# Patient Record
Sex: Female | Born: 1995 | Race: White | Hispanic: No | Marital: Single | Smoking: Never smoker
Health system: Southern US, Community
[De-identification: ages and names within clinical notes are randomized; demographics above are authoritative.]

---
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
## 2017-10-18 DIAGNOSIS — Z23 Encounter for immunization: Secondary | ICD-10-CM | POA: Diagnosis not present

## 2017-10-19 DIAGNOSIS — B349 Viral infection, unspecified: Secondary | ICD-10-CM | POA: Diagnosis not present

## 2017-11-12 DIAGNOSIS — Z0000 Encounter for general adult medical examination without abnormal findings: Secondary | ICD-10-CM | POA: Diagnosis not present

## 2017-11-12 DIAGNOSIS — Z68.41 Body mass index (BMI) pediatric, 5th percentile to less than 85th percentile for age: Secondary | ICD-10-CM | POA: Diagnosis not present

## 2017-11-12 DIAGNOSIS — Z7182 Exercise counseling: Secondary | ICD-10-CM | POA: Diagnosis not present

## 2017-11-12 DIAGNOSIS — Z23 Encounter for immunization: Secondary | ICD-10-CM | POA: Diagnosis not present

## 2017-11-12 DIAGNOSIS — Z713 Dietary counseling and surveillance: Secondary | ICD-10-CM | POA: Diagnosis not present

## 2019-01-07 DIAGNOSIS — B373 Candidiasis of vulva and vagina: Secondary | ICD-10-CM | POA: Diagnosis not present

## 2019-05-04 DIAGNOSIS — F411 Generalized anxiety disorder: Secondary | ICD-10-CM | POA: Diagnosis not present

## 2019-05-11 DIAGNOSIS — F411 Generalized anxiety disorder: Secondary | ICD-10-CM | POA: Diagnosis not present

## 2019-05-18 DIAGNOSIS — F411 Generalized anxiety disorder: Secondary | ICD-10-CM | POA: Diagnosis not present

## 2019-10-13 DIAGNOSIS — H6121 Impacted cerumen, right ear: Secondary | ICD-10-CM | POA: Diagnosis not present

## 2019-10-13 DIAGNOSIS — J309 Allergic rhinitis, unspecified: Secondary | ICD-10-CM | POA: Diagnosis not present

## 2019-10-13 DIAGNOSIS — Z03818 Encounter for observation for suspected exposure to other biological agents ruled out: Secondary | ICD-10-CM | POA: Diagnosis not present

## 2019-11-12 DIAGNOSIS — L700 Acne vulgaris: Secondary | ICD-10-CM | POA: Diagnosis not present

## 2019-11-12 DIAGNOSIS — L858 Other specified epidermal thickening: Secondary | ICD-10-CM | POA: Diagnosis not present

## 2020-06-01 ENCOUNTER — Ambulatory Visit: Payer: BC Managed Care – PPO | Attending: Internal Medicine

## 2020-06-01 ENCOUNTER — Encounter (HOSPITAL_COMMUNITY): Payer: Self-pay

## 2020-06-01 ENCOUNTER — Emergency Department (HOSPITAL_COMMUNITY)
Admission: EM | Admit: 2020-06-01 | Discharge: 2020-06-01 | Disposition: A | Discharge status: Home / Self Care | Payer: BC Managed Care – PPO | Attending: Emergency Medicine | Admitting: Emergency Medicine

## 2020-06-01 ENCOUNTER — Other Ambulatory Visit: Payer: Self-pay

## 2020-06-01 DIAGNOSIS — R002 Palpitations: Secondary | ICD-10-CM | POA: Diagnosis present

## 2020-06-01 DIAGNOSIS — R519 Headache, unspecified: Secondary | ICD-10-CM | POA: Diagnosis not present

## 2020-06-01 DIAGNOSIS — R000 Tachycardia, unspecified: Secondary | ICD-10-CM | POA: Diagnosis not present

## 2020-06-01 LAB — TSH: TSH: 1.767 u[IU]/mL (ref 0.350–4.500)

## 2020-06-01 LAB — BASIC METABOLIC PANEL
Anion gap: 10 (ref 5–15)
BUN: 8 mg/dL (ref 6–20)
CO2: 23 mmol/L (ref 22–32)
Calcium: 9.5 mg/dL (ref 8.9–10.3)
Chloride: 107 mmol/L (ref 98–111)
Creatinine, Ser: 0.91 mg/dL (ref 0.44–1.00)
GFR calc Af Amer: >60 mL/min (ref >60)
GFR calc non Af Amer: >60 mL/min (ref >60)
Glucose, Bld: 120 mg/dL — ABNORMAL HIGH (ref 70–99)
Potassium: 3.5 mmol/L (ref 3.5–5.1)
Sodium: 140 mmol/L (ref 135–145)

## 2020-06-01 LAB — I-STAT BETA HCG BLOOD, ED (MC, WL, AP ONLY): I-stat hCG, quantitative: <5 m[IU]/mL (ref <5)

## 2020-06-01 LAB — CBC
HCT: 42 % (ref 36.0–46.0)
Hemoglobin: 13.9 g/dL (ref 12.0–15.0)
MCH: 31 pg (ref 26.0–34.0)
MCHC: 33.1 g/dL (ref 30.0–36.0)
MCV: 93.5 fL (ref 80.0–100.0)
Platelets: 301 10*3/uL (ref 150–400)
RBC: 4.49 MIL/uL (ref 3.87–5.11)
RDW: 12.1 % (ref 11.5–15.5)
WBC: 4.7 10*3/uL (ref 4.0–10.5)
nRBC: 0 % (ref 0.0–0.2)

## 2020-06-01 LAB — TROPONIN I (HIGH SENSITIVITY): Troponin I (High Sensitivity): <2 ng/L (ref <18)

## 2020-06-01 NOTE — ED Notes (Signed)
Pt discharge instructions and follow-up care reviewed with the patient. The patient verbalized understanding of both. Pt discharged. 

## 2020-06-01 NOTE — Discharge Instructions (Addendum)
Home to rest and hydrate.  Return to ER for worsening or concerning symptoms otherwise follow-up with your PCP as planned.

## 2020-06-01 NOTE — ED Triage Notes (Signed)
Pt reports headache since the 4th after drinking all day. Pt states the headache has been constant since then. Pt concerned about her tachycardia, HR 120s in triage. Pt also reports palpitations for a while.

## 2020-06-01 NOTE — ED Provider Notes (Signed)
[facility] EMERGENCY DEPARTMENT Provider Note   CSN: [id number] Arrival date & time: 06/01/20  0941     History Chief Complaint  Patient presents with  . Headache  . Anxiety    Stacy Abbott is a 24 y.o. female.  24 year old female presents with headache and palpitations. Patient states for the past few years she has noticed that her heart races at times, has never had any associated chest pain or shortness of breath, has not been worked up for this. Patient states for the past 3 days she notices her heart rate increases with standing up, does not feel weak/dizzy/SHOB/CP with this. Also reports band distribution headache across occipital/mastoid area earlier today that has since resolved. Patient went to UC today for her symptoms, had an abnormal EKG with rate of 130 and was sent to the ER. Patient states this made her feel very anxious by the time she arrived in the ER but after an extensive wait time she is feeling better and symptoms have resolved. No other complaints or concerns at this time.         History reviewed. No pertinent past medical history.  Patient Active Problem List   Diagnosis Date Noted  . 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 GAD (generalized anxiety disorder) 09/30/2015  . 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 No pertinent surgical history.   OB History   No obstetric history on file.     No family history on file.  Social History   Tobacco Use  . Smoking status: Never Smoker  Substance Use Topics  . Alcohol use: No  . Drug use: No    Home Medications Prior to Admission medications   Medication Sig Start Date End Date Taking? Authorizing Provider  hydrOXYzine (VISTARIL) 25 MG capsule Take 1 capsule (25 mg total) by mouth daily at 8 pm. 06/01/16   Leata Mouse, MD  lisdexamfetamine (VYVANSE) 30 MG capsule Take 1 capsule (30 mg total) by mouth  daily. 06/01/16   Leata Mouse, MD    Allergies    Patient has no known allergies.  Review of Systems   Review of Systems  Constitutional: Negative for fever.  Respiratory: Negative for chest tightness and shortness of breath.   Cardiovascular: Positive for palpitations. Negative for chest pain.  Gastrointestinal: Negative for nausea and vomiting.  Musculoskeletal: Negative for arthralgias and myalgias.  Skin: Negative for rash and wound.  Allergic/Immunologic: Negative for immunocompromised state.  Neurological: Negative for dizziness, weakness and light-headedness.  Psychiatric/Behavioral: Negative for confusion.  All other systems reviewed and are negative.   Physical Exam Updated Vital Signs BP (!) 147/95 (BP Location: Right Arm)   Pulse 96   Temp 98.4 F (36.9 C) (Oral)   Resp 18   Ht 5\' 5"  (1.651 m)   Wt 56.7 kg   LMP 05/25/2020   SpO2 100%   BMI 20.80 kg/m   Physical Exam Vitals and nursing note reviewed.  Constitutional:      General: She is not in acute distress.    Appearance: She is well-developed. She is not diaphoretic.  HENT:     Head: Normocephalic and atraumatic.  Cardiovascular:     Heart sounds: Normal heart sounds.  Pulmonary:     Effort: Pulmonary effort is normal.     Breath sounds: Normal breath sounds.  Skin:    General: Skin is warm and dry.     Findings: No erythema or rash.  Neurological:     Mental Status: She is alert and oriented to person, place, and time.     GCS: GCS eye subscore is 4. GCS verbal subscore is 5. GCS motor subscore is 6.     Cranial Nerves: No facial asymmetry.     Sensory: No sensory deficit.     Motor: No weakness.     Coordination: Romberg sign negative. Coordination normal.     Gait: Gait normal.  Psychiatric:        Behavior: Behavior normal.     ED Results / Procedures / Treatments   Labs (all labs ordered are listed, but only abnormal results are displayed) Labs Reviewed  BASIC METABOLIC  PANEL - Abnormal; Notable for the following components:      Result Value   Glucose, Bld 120 (*)    All other components within normal limits  CBC  TSH  I-STAT BETA HCG BLOOD, ED ([facility], [facility], [facility] ONLY)  TROPONIN I (HIGH SENSITIVITY)    EKG EKG Interpretation  Date/Time:  Wednesday June 01 2020 09:52:33 EDT Ventricular Rate:  111 PR Interval:  128 QRS Duration: 80 QT Interval:  306 QTC Calculation: 416 R Axis:   100 Text Interpretation: Sinus tachycardia Rightward axis Borderline ECG inus tach, otherwise normal, no old comparison Confirmed by Arby Barrette [phone]) on 06/01/2020 5:08:40 PM   Radiology No results found.  Procedures Procedures (including critical care time)  Medications Ordered in ED Medications - No data to display  ED Course  I have reviewed the triage vital signs and the nursing notes.  Pertinent labs & imaging results that were available during my care of the patient were reviewed by me and considered in my medical decision making (see chart for details).  Clinical Course as of Jun 01 1721  Wed Jun 01, 2020  1711 Orthostatic VS: Lying BP 119/83, HR 59 Sitting BP 136/93, HR 54 Standing BP 136/99, HR 67 Patient asymptomatic throughout.   [LM]  [id number] 24 year old female presents with complaint of palpitations.  Patient states that she feels her heart racing at times for the past several years however for the past 3 days she notices that when she stands up her heart rate accelerates.  Patient does not have any associated symptoms with this however did have an occipital headache earlier today.  Patient went to urgent care, had a EKG and was told that her EKG was abnormal with a rate of 130 and was sent to the hospital for further evaluation.  On arrival, patient states that she was feeling anxious however after several hours of waiting her symptoms have completely resolved.  Patient has never been seen for this previously, no family cardiac history.  Exam is  unremarkable.  Patient is not orthostatic.  Labs are unremarkable including CBC, BMP, hCG negative, troponin less than 2 without chest pain, doubt ACS.  TSH within normal limits.  Patient is scheduled to see her PCP next week, recommend that she keep this appointment, return to the ER sooner for any new or worsening symptoms.  Recommend home to hydrate and rest, discuss ambulatory monitoring with her PCP.   [LM]    Clinical Course User Index [LM] Alden Hipp   MDM Rules/Calculators/A&P                          Final Clinical Impression(s) / ED Diagnoses Final diagnoses:  Palpitations  Tachycardia    Rx / DC Orders ED  Discharge Orders    None       Alden Hipp 06/01/20 1722    Arby Barrette, MD 06/01/20 1755

## 2020-06-02 ENCOUNTER — Ambulatory Visit: Payer: BC Managed Care – PPO | Attending: Internal Medicine

## 2020-06-02 DIAGNOSIS — Z20822 Contact with and (suspected) exposure to covid-19: Secondary | ICD-10-CM

## 2020-06-03 LAB — NOVEL CORONAVIRUS, NAA: SARS-CoV-2, NAA: NOT DETECTED

## 2020-06-03 LAB — SARS-COV-2, NAA 2 DAY TAT
